# Patient Record
Sex: Female | Born: 1970 | Race: White | Hispanic: No | Smoking: Current every day smoker
Health system: Southern US, Community
[De-identification: ages and names within clinical notes are randomized; demographics above are authoritative.]

## PROBLEM LIST (undated history)

## (undated) DIAGNOSIS — E119 Type 2 diabetes mellitus without complications: Secondary | ICD-10-CM

## (undated) DIAGNOSIS — T8859XA Other complications of anesthesia, initial encounter: Secondary | ICD-10-CM

## (undated) DIAGNOSIS — F419 Anxiety disorder, unspecified: Secondary | ICD-10-CM

## (undated) DIAGNOSIS — R569 Unspecified convulsions: Secondary | ICD-10-CM

## (undated) DIAGNOSIS — F32A Depression, unspecified: Secondary | ICD-10-CM

## (undated) DIAGNOSIS — E785 Hyperlipidemia, unspecified: Secondary | ICD-10-CM

## (undated) DIAGNOSIS — E282 Polycystic ovarian syndrome: Secondary | ICD-10-CM

## (undated) DIAGNOSIS — S0990XA Unspecified injury of head, initial encounter: Secondary | ICD-10-CM

## (undated) DIAGNOSIS — F329 Major depressive disorder, single episode, unspecified: Secondary | ICD-10-CM

## (undated) DIAGNOSIS — T4145XA Adverse effect of unspecified anesthetic, initial encounter: Secondary | ICD-10-CM

## (undated) DIAGNOSIS — IMO0001 Reserved for inherently not codable concepts without codable children: Secondary | ICD-10-CM

## (undated) HISTORY — PX: BILATERAL CARPAL TUNNEL RELEASE: SHX6508

---
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
## 2014-12-29 ENCOUNTER — Other Ambulatory Visit: Payer: Self-pay | Admitting: Orthopedic Surgery

## 2015-01-02 ENCOUNTER — Encounter (HOSPITAL_COMMUNITY): Payer: Self-pay | Admitting: *Deleted

## 2015-01-02 MED ORDER — CEFAZOLIN SODIUM-DEXTROSE 2-3 GM-% IV SOLR
2.0000 g | INTRAVENOUS | Status: AC
  Administered 2015-01-03: 2 g via INTRAVENOUS

## 2015-01-02 NOTE — Progress Notes (Signed)
Ms Sara Oconnor is a Type 1 Diabetic. PCP Dr Cristobal GoldmannBorun in [facility]  Manages diabetes.  Patient reported that last A1C was > 9.0.  Patient stated that CBGs have been running  From 230 to 320.  Patient has history of CBG's dropping and patient had a seizure- last time was 3 years ago.  I have requested records from Dr Iona BeardBorun's office.  Patient is aware of NPO after midnight and also aware to not take any Insulin in am.  I instructed patient to drink 1/2 cup of clear juice if CBG should be less than 70 and to recheck CBG and call Pre- Op for further instructions.  I also instructed patient to call pre op if CBG 250 or >.  Patient voiced understanding.

## 2015-01-03 ENCOUNTER — Ambulatory Visit (HOSPITAL_COMMUNITY): Payer: Worker's Compensation

## 2015-01-03 ENCOUNTER — Ambulatory Visit (HOSPITAL_COMMUNITY)
Admission: RE | Admit: 2015-01-03 | Discharge: 2015-01-03 | Disposition: A | Discharge status: Home / Self Care | Payer: Worker's Compensation | Source: Ambulatory Visit | Attending: Orthopedic Surgery | Admitting: Orthopedic Surgery

## 2015-01-03 ENCOUNTER — Ambulatory Visit (HOSPITAL_COMMUNITY): Payer: Worker's Compensation | Admitting: Anesthesiology

## 2015-01-03 ENCOUNTER — Encounter (HOSPITAL_COMMUNITY)
Admission: RE | Disposition: A | Discharge status: Home / Self Care | Payer: Self-pay | Source: Ambulatory Visit | Attending: Orthopedic Surgery

## 2015-01-03 ENCOUNTER — Other Ambulatory Visit: Payer: Self-pay

## 2015-01-03 ENCOUNTER — Encounter (HOSPITAL_COMMUNITY): Payer: Self-pay | Admitting: *Deleted

## 2015-01-03 DIAGNOSIS — F419 Anxiety disorder, unspecified: Secondary | ICD-10-CM | POA: Diagnosis not present

## 2015-01-03 DIAGNOSIS — M5410 Radiculopathy, site unspecified: Secondary | ICD-10-CM

## 2015-01-03 DIAGNOSIS — Z7982 Long term (current) use of aspirin: Secondary | ICD-10-CM | POA: Diagnosis not present

## 2015-01-03 DIAGNOSIS — Z6841 Body Mass Index (BMI) 40.0 and over, adult: Secondary | ICD-10-CM | POA: Diagnosis not present

## 2015-01-03 DIAGNOSIS — E119 Type 2 diabetes mellitus without complications: Secondary | ICD-10-CM | POA: Diagnosis not present

## 2015-01-03 DIAGNOSIS — F329 Major depressive disorder, single episode, unspecified: Secondary | ICD-10-CM | POA: Insufficient documentation

## 2015-01-03 DIAGNOSIS — Z794 Long term (current) use of insulin: Secondary | ICD-10-CM | POA: Diagnosis not present

## 2015-01-03 DIAGNOSIS — Z01818 Encounter for other preprocedural examination: Secondary | ICD-10-CM

## 2015-01-03 DIAGNOSIS — E785 Hyperlipidemia, unspecified: Secondary | ICD-10-CM | POA: Diagnosis not present

## 2015-01-03 DIAGNOSIS — M5116 Intervertebral disc disorders with radiculopathy, lumbar region: Secondary | ICD-10-CM | POA: Insufficient documentation

## 2015-01-03 DIAGNOSIS — F17210 Nicotine dependence, cigarettes, uncomplicated: Secondary | ICD-10-CM | POA: Diagnosis not present

## 2015-01-03 HISTORY — PX: LUMBAR LAMINECTOMY/DECOMPRESSION MICRODISCECTOMY: SHX5026

## 2015-01-03 HISTORY — DX: Adverse effect of unspecified anesthetic, initial encounter: T41.45XA

## 2015-01-03 HISTORY — DX: Depression, unspecified: F32.A

## 2015-01-03 HISTORY — DX: Type 2 diabetes mellitus without complications: E11.9

## 2015-01-03 HISTORY — DX: Other complications of anesthesia, initial encounter: T88.59XA

## 2015-01-03 HISTORY — DX: Polycystic ovarian syndrome: E28.2

## 2015-01-03 HISTORY — DX: Unspecified convulsions: R56.9

## 2015-01-03 HISTORY — DX: Unspecified injury of head, initial encounter: S09.90XA

## 2015-01-03 HISTORY — DX: Anxiety disorder, unspecified: F41.9

## 2015-01-03 HISTORY — DX: Reserved for inherently not codable concepts without codable children: IMO0001

## 2015-01-03 HISTORY — DX: Hyperlipidemia, unspecified: E78.5

## 2015-01-03 HISTORY — DX: Major depressive disorder, single episode, unspecified: F32.9

## 2015-01-03 LAB — COMPREHENSIVE METABOLIC PANEL
ALT: 42 U/L (ref 14–54)
ANION GAP: 8 (ref 5–15)
AST: 27 U/L (ref 15–41)
Albumin: 3.3 g/dL — ABNORMAL LOW (ref 3.5–5.0)
Alkaline Phosphatase: 87 U/L (ref 38–126)
BUN: 11 mg/dL (ref 6–20)
CO2: 23 mmol/L (ref 22–32)
Calcium: 8.9 mg/dL (ref 8.9–10.3)
Chloride: 107 mmol/L (ref 101–111)
Creatinine, Ser: 0.79 mg/dL (ref 0.44–1.00)
GFR calc Af Amer: >60 mL/min (ref >60)
GFR calc non Af Amer: >60 mL/min (ref >60)
Glucose, Bld: 307 mg/dL — ABNORMAL HIGH (ref 70–99)
Potassium: 4.4 mmol/L (ref 3.5–5.1)
SODIUM: 138 mmol/L (ref 135–145)
TOTAL PROTEIN: 5.8 g/dL — AB (ref 6.5–8.1)
Total Bilirubin: 0.5 mg/dL (ref 0.3–1.2)

## 2015-01-03 LAB — PROTIME-INR
INR: 0.99 (ref 0.00–1.49)
Prothrombin Time: 13.2 seconds (ref 11.6–15.2)

## 2015-01-03 LAB — CBC WITH DIFFERENTIAL/PLATELET
BASOS ABS: 0.1 10*3/uL (ref 0.0–0.1)
Basophils Relative: 1 % (ref 0–1)
Eosinophils Absolute: 0.6 10*3/uL (ref 0.0–0.7)
Eosinophils Relative: 6 % — ABNORMAL HIGH (ref 0–5)
HCT: 41.7 % (ref 36.0–46.0)
Hemoglobin: 14 g/dL (ref 12.0–15.0)
LYMPHS ABS: 3.1 10*3/uL (ref 0.7–4.0)
Lymphocytes Relative: 28 % (ref 12–46)
MCH: 30.8 pg (ref 26.0–34.0)
MCHC: 33.6 g/dL (ref 30.0–36.0)
MCV: 91.9 fL (ref 78.0–100.0)
Monocytes Absolute: 0.5 10*3/uL (ref 0.1–1.0)
Monocytes Relative: 5 % (ref 3–12)
Neutro Abs: 6.8 10*3/uL (ref 1.7–7.7)
Neutrophils Relative %: 60 % (ref 43–77)
PLATELETS: 248 10*3/uL (ref 150–400)
RBC: 4.54 MIL/uL (ref 3.87–5.11)
RDW: 13.5 % (ref 11.5–15.5)
WBC: 11.1 10*3/uL — ABNORMAL HIGH (ref 4.0–10.5)

## 2015-01-03 LAB — SURGICAL PCR SCREEN
MRSA, PCR: NEGATIVE
STAPHYLOCOCCUS AUREUS: POSITIVE — AB

## 2015-01-03 LAB — URINALYSIS, ROUTINE W REFLEX MICROSCOPIC
Bilirubin Urine: NEGATIVE
Hgb urine dipstick: NEGATIVE
Ketones, ur: 15 mg/dL — AB
LEUKOCYTES UA: NEGATIVE
Nitrite: NEGATIVE
Protein, ur: NEGATIVE mg/dL
Specific Gravity, Urine: 1.023 (ref 1.005–1.030)
Urobilinogen, UA: 0.2 mg/dL (ref 0.0–1.0)
pH: 5 (ref 5.0–8.0)

## 2015-01-03 LAB — GLUCOSE, CAPILLARY
GLUCOSE-CAPILLARY: 303 mg/dL — AB (ref 70–99)
GLUCOSE-CAPILLARY: 259 mg/dL — AB (ref 70–99)
GLUCOSE-CAPILLARY: 223 mg/dL — AB (ref 70–99)
Glucose-Capillary: 306 mg/dL — ABNORMAL HIGH (ref 70–99)
Glucose-Capillary: 246 mg/dL — ABNORMAL HIGH (ref 70–99)

## 2015-01-03 LAB — APTT: aPTT: 27 seconds (ref 24–37)

## 2015-01-03 LAB — HCG, SERUM, QUALITATIVE: Preg, Serum: NEGATIVE

## 2015-01-03 LAB — URINE MICROSCOPIC-ADD ON

## 2015-01-03 SURGERY — LUMBAR LAMINECTOMY/DECOMPRESSION MICRODISCECTOMY
Anesthesia: General | Site: Back | Laterality: Left

## 2015-01-03 MED ORDER — METHYLPREDNISOLONE ACETATE 40 MG/ML IJ SUSP
INTRAMUSCULAR | Status: DC | PRN
  Administered 2015-01-03: 40 mg via INTRA_ARTICULAR

## 2015-01-03 MED ORDER — OXYCODONE-ACETAMINOPHEN 5-325 MG PO TABS
2.0000 | ORAL_TABLET | Freq: Once | ORAL | Status: AC
  Administered 2015-01-03: 2 via ORAL

## 2015-01-03 MED ORDER — DIAZEPAM 5 MG PO TABS
5.0000 mg | ORAL_TABLET | Freq: Once | ORAL | Status: AC
  Administered 2015-01-03: 5 mg via ORAL

## 2015-01-03 MED ORDER — THROMBIN 20000 UNITS EX SOLR
CUTANEOUS | Status: DC | PRN
  Administered 2015-01-03: 20 mL via TOPICAL

## 2015-01-03 MED ORDER — BUPIVACAINE-EPINEPHRINE 0.25% -1:200000 IJ SOLN
INTRAMUSCULAR | Status: DC | PRN
  Administered 2015-01-03: 21 mL

## 2015-01-03 MED ORDER — ONDANSETRON HCL 4 MG/2ML IJ SOLN
INTRAMUSCULAR | Status: DC | PRN
  Administered 2015-01-03: 4 mg via INTRAVENOUS

## 2015-01-03 MED ORDER — PROPOFOL 10 MG/ML IV BOLUS
INTRAVENOUS | Status: AC
  Filled 2015-01-03: qty 20

## 2015-01-03 MED ORDER — FENTANYL CITRATE (PF) 250 MCG/5ML IJ SOLN
INTRAMUSCULAR | Status: AC
  Filled 2015-01-03: qty 5

## 2015-01-03 MED ORDER — ACETAMINOPHEN 10 MG/ML IV SOLN
1000.0000 mg | INTRAVENOUS | Status: AC
  Administered 2015-01-03: 1000 mg via INTRAVENOUS
  Filled 2015-01-03: qty 100

## 2015-01-03 MED ORDER — BUPIVACAINE-EPINEPHRINE (PF) 0.25% -1:200000 IJ SOLN
INTRAMUSCULAR | Status: AC
  Filled 2015-01-03: qty 30

## 2015-01-03 MED ORDER — MUPIROCIN 2 % EX OINT
1.0000 "application " | TOPICAL_OINTMENT | Freq: Once | CUTANEOUS | Status: AC
  Administered 2015-01-03: 1 via TOPICAL

## 2015-01-03 MED ORDER — OXYCODONE-ACETAMINOPHEN 5-325 MG PO TABS
ORAL_TABLET | ORAL | Status: AC
  Filled 2015-01-03: qty 2

## 2015-01-03 MED ORDER — METHYLPREDNISOLONE ACETATE 40 MG/ML IJ SUSP
INTRAMUSCULAR | Status: AC
  Filled 2015-01-03: qty 1

## 2015-01-03 MED ORDER — DEXAMETHASONE SODIUM PHOSPHATE 4 MG/ML IJ SOLN
INTRAMUSCULAR | Status: DC | PRN
Start: 2015-01-03 — End: 2015-01-04
  Administered 2015-01-03: 4 mg via INTRAVENOUS

## 2015-01-03 MED ORDER — GLYCOPYRROLATE 0.2 MG/ML IJ SOLN
INTRAMUSCULAR | Status: DC | PRN
  Administered 2015-01-03: 0.6 mg via INTRAVENOUS

## 2015-01-03 MED ORDER — 0.9 % SODIUM CHLORIDE (POUR BTL) OPTIME
TOPICAL | Status: DC | PRN
  Administered 2015-01-03: 1000 mL

## 2015-01-03 MED ORDER — MIDAZOLAM HCL 5 MG/5ML IJ SOLN
INTRAMUSCULAR | Status: DC | PRN
  Administered 2015-01-03: 2 mg via INTRAVENOUS

## 2015-01-03 MED ORDER — PROMETHAZINE HCL 25 MG/ML IJ SOLN: 6.2500 mg | INTRAMUSCULAR | Status: DC | PRN

## 2015-01-03 MED ORDER — INSULIN ASPART 100 UNIT/ML ~~LOC~~ SOLN
10.0000 [IU] | Freq: Once | SUBCUTANEOUS | Status: AC
  Administered 2015-01-03: 10 [IU] via SUBCUTANEOUS

## 2015-01-03 MED ORDER — METHYLENE BLUE 1 % INJ SOLN
INTRAMUSCULAR | Status: DC | PRN
  Administered 2015-01-03: 1 mL via SUBMUCOSAL

## 2015-01-03 MED ORDER — HYDROMORPHONE HCL 1 MG/ML IJ SOLN
INTRAMUSCULAR | Status: AC
  Administered 2015-01-03: 0.5 mg via INTRAVENOUS
  Filled 2015-01-03: qty 2

## 2015-01-03 MED ORDER — MIDAZOLAM HCL 2 MG/2ML IJ SOLN
INTRAMUSCULAR | Status: AC
  Filled 2015-01-03: qty 2

## 2015-01-03 MED ORDER — HEMOSTATIC AGENTS (NO CHARGE) OPTIME
TOPICAL | Status: DC | PRN
  Administered 2015-01-03: 1 via TOPICAL

## 2015-01-03 MED ORDER — FENTANYL CITRATE (PF) 100 MCG/2ML IJ SOLN
INTRAMUSCULAR | Status: DC | PRN
  Administered 2015-01-03 (×2): 50 ug via INTRAVENOUS

## 2015-01-03 MED ORDER — NEOSTIGMINE METHYLSULFATE 10 MG/10ML IV SOLN
INTRAVENOUS | Status: DC | PRN
  Administered 2015-01-03: 5 mg via INTRAVENOUS

## 2015-01-03 MED ORDER — MUPIROCIN 2 % EX OINT
TOPICAL_OINTMENT | CUTANEOUS | Status: AC
  Filled 2015-01-03: qty 22

## 2015-01-03 MED ORDER — LIDOCAINE HCL (CARDIAC) 20 MG/ML IV SOLN
INTRAVENOUS | Status: DC | PRN
  Administered 2015-01-03: 100 mg via INTRAVENOUS

## 2015-01-03 MED ORDER — PROPOFOL 10 MG/ML IV BOLUS
INTRAVENOUS | Status: DC | PRN
  Administered 2015-01-03: 200 mg via INTRAVENOUS

## 2015-01-03 MED ORDER — LACTATED RINGERS IV SOLN
INTRAVENOUS | Status: DC | PRN
  Administered 2015-01-03 (×2): via INTRAVENOUS

## 2015-01-03 MED ORDER — METHYLENE BLUE 1 % INJ SOLN
INTRAMUSCULAR | Status: AC
  Filled 2015-01-03: qty 10

## 2015-01-03 MED ORDER — SUCCINYLCHOLINE CHLORIDE 20 MG/ML IJ SOLN
INTRAMUSCULAR | Status: DC | PRN
Start: 2015-01-03 — End: 2015-01-04
  Administered 2015-01-03: 80 mg via INTRAVENOUS
  Administered 2015-01-03: 120 mg via INTRAVENOUS
  Administered 2015-01-03: 100 mg via INTRAVENOUS

## 2015-01-03 MED ORDER — VECURONIUM BROMIDE 10 MG IV SOLR
INTRAVENOUS | Status: DC | PRN
  Administered 2015-01-03: 5 mg via INTRAVENOUS
  Administered 2015-01-03: 2 mg via INTRAVENOUS

## 2015-01-03 MED ORDER — THROMBIN 20000 UNITS EX SOLR
CUTANEOUS | Status: AC
Start: 2015-01-03 — End: 2015-01-03
  Filled 2015-01-03: qty 20000

## 2015-01-03 MED ORDER — LIDOCAINE HCL (CARDIAC) 20 MG/ML IV SOLN
INTRAVENOUS | Status: AC
  Filled 2015-01-03: qty 5

## 2015-01-03 MED ORDER — DIAZEPAM 5 MG PO TABS
ORAL_TABLET | ORAL | Status: AC
  Filled 2015-01-03: qty 1

## 2015-01-03 MED ORDER — POVIDONE-IODINE 7.5 % EX SOLN: Freq: Once | CUTANEOUS | Status: DC

## 2015-01-03 MED ORDER — ROCURONIUM BROMIDE 50 MG/5ML IV SOLN
INTRAVENOUS | Status: AC
  Filled 2015-01-03: qty 1

## 2015-01-03 MED ORDER — HYDROMORPHONE HCL 1 MG/ML IJ SOLN
0.2500 mg | INTRAMUSCULAR | Status: DC | PRN
Start: 2015-01-03 — End: 2015-01-04
  Administered 2015-01-03 (×3): 0.5 mg via INTRAVENOUS

## 2015-01-03 SURGICAL SUPPLY — 75 items
BENZOIN TINCTURE PRP APPL 2/3 (GAUZE/BANDAGES/DRESSINGS) ×3 IMPLANT
BNDG GAUZE ELAST 4 BULKY (GAUZE/BANDAGES/DRESSINGS) ×3 IMPLANT
BUR ROUND PRECISION 4.0 (BURR) ×2 IMPLANT
BUR ROUND PRECISION 4.0MM (BURR) ×1
CANISTER SUCTION 2500CC (MISCELLANEOUS) ×3 IMPLANT
CARTRIDGE OIL MAESTRO DRILL (MISCELLANEOUS) ×1 IMPLANT
CLOSURE STERI-STRIP 1/2X4 (GAUZE/BANDAGES/DRESSINGS) ×1
CLOSURE WOUND 1/2 X4 (GAUZE/BANDAGES/DRESSINGS)
CLSR STERI-STRIP ANTIMIC 1/2X4 (GAUZE/BANDAGES/DRESSINGS) ×2 IMPLANT
CORDS BIPOLAR (ELECTRODE) ×3 IMPLANT
COVER SURGICAL LIGHT HANDLE (MISCELLANEOUS) ×3 IMPLANT
DIFFUSER DRILL AIR PNEUMATIC (MISCELLANEOUS) ×3 IMPLANT
DRAIN CHANNEL 15F RND FF W/TCR (WOUND CARE) IMPLANT
DRAPE POUCH INSTRU U-SHP 10X18 (DRAPES) ×6 IMPLANT
DRAPE SURG 17X23 STRL (DRAPES) ×12 IMPLANT
DURAPREP 26ML APPLICATOR (WOUND CARE) ×3 IMPLANT
ELECT BLADE 4.0 EZ CLEAN MEGAD (MISCELLANEOUS)
ELECT CAUTERY BLADE 6.4 (BLADE) ×3 IMPLANT
ELECT REM PT RETURN 9FT ADLT (ELECTROSURGICAL) ×3
ELECTRODE BLDE 4.0 EZ CLN MEGD (MISCELLANEOUS) IMPLANT
ELECTRODE REM PT RTRN 9FT ADLT (ELECTROSURGICAL) ×1 IMPLANT
EVACUATOR SILICONE 100CC (DRAIN) IMPLANT
FILTER STRAW FLUID ASPIR (MISCELLANEOUS) ×3 IMPLANT
GAUZE SPONGE 4X4 12PLY STRL (GAUZE/BANDAGES/DRESSINGS) ×3 IMPLANT
GAUZE SPONGE 4X4 16PLY XRAY LF (GAUZE/BANDAGES/DRESSINGS) ×6 IMPLANT
GLOVE BIO SURGEON STRL SZ7 (GLOVE) ×3 IMPLANT
GLOVE BIO SURGEON STRL SZ8 (GLOVE) ×3 IMPLANT
GLOVE BIOGEL PI IND STRL 7.0 (GLOVE) ×1 IMPLANT
GLOVE BIOGEL PI IND STRL 8 (GLOVE) ×1 IMPLANT
GLOVE BIOGEL PI INDICATOR 7.0 (GLOVE) ×2
GLOVE BIOGEL PI INDICATOR 8 (GLOVE) ×2
GOWN STRL REUS W/ TWL LRG LVL3 (GOWN DISPOSABLE) ×1 IMPLANT
GOWN STRL REUS W/ TWL XL LVL3 (GOWN DISPOSABLE) ×2 IMPLANT
GOWN STRL REUS W/TWL LRG LVL3 (GOWN DISPOSABLE) ×2
GOWN STRL REUS W/TWL XL LVL3 (GOWN DISPOSABLE) ×4
IV CATH 14GX2 1/4 (CATHETERS) ×3 IMPLANT
KIT BASIN OR (CUSTOM PROCEDURE TRAY) ×3 IMPLANT
KIT POSITION SURG JACKSON T1 (MISCELLANEOUS) ×3 IMPLANT
KIT ROOM TURNOVER OR (KITS) ×3 IMPLANT
MARKER SKIN DUAL TIP RULER LAB (MISCELLANEOUS) ×3 IMPLANT
NEEDLE 18GX1X1/2 (RX/OR ONLY) (NEEDLE) ×3 IMPLANT
NEEDLE 22X1 1/2 (OR ONLY) (NEEDLE) ×6 IMPLANT
NEEDLE HYPO 25GX1X1/2 BEV (NEEDLE) ×3 IMPLANT
NEEDLE SPNL 18GX3.5 QUINCKE PK (NEEDLE) ×6 IMPLANT
NS IRRIG 1000ML POUR BTL (IV SOLUTION) ×3 IMPLANT
OIL CARTRIDGE MAESTRO DRILL (MISCELLANEOUS) ×3
PACK LAMINECTOMY ORTHO (CUSTOM PROCEDURE TRAY) ×3 IMPLANT
PACK UNIVERSAL I (CUSTOM PROCEDURE TRAY) ×3 IMPLANT
PAD ARMBOARD 7.5X6 YLW CONV (MISCELLANEOUS) ×6 IMPLANT
PATTIES SURGICAL .5 X.5 (GAUZE/BANDAGES/DRESSINGS) IMPLANT
PATTIES SURGICAL .5 X1 (DISPOSABLE) ×3 IMPLANT
SPONGE INTESTINAL PEANUT (DISPOSABLE) ×3 IMPLANT
SPONGE SURGIFOAM ABS GEL 100 (HEMOSTASIS) ×3 IMPLANT
SPONGE SURGIFOAM ABS GEL SZ50 (HEMOSTASIS) ×3 IMPLANT
STRIP CLOSURE SKIN 1/2X4 (GAUZE/BANDAGES/DRESSINGS) IMPLANT
SURGIFLO TRUKIT (HEMOSTASIS) ×3 IMPLANT
SUT MNCRL AB 4-0 PS2 18 (SUTURE) ×3 IMPLANT
SUT VIC AB 0 CT1 18XCR BRD 8 (SUTURE) IMPLANT
SUT VIC AB 0 CT1 27 (SUTURE)
SUT VIC AB 0 CT1 27XBRD ANBCTR (SUTURE) IMPLANT
SUT VIC AB 0 CT1 8-18 (SUTURE)
SUT VIC AB 1 CT1 18XCR BRD 8 (SUTURE) ×1 IMPLANT
SUT VIC AB 1 CT1 8-18 (SUTURE) ×2
SUT VIC AB 2-0 CT2 18 VCP726D (SUTURE) ×3 IMPLANT
SYR 20CC LL (SYRINGE) IMPLANT
SYR 3ML LL SCALE MARK (SYRINGE) ×3 IMPLANT
SYR BULB IRRIGATION 50ML (SYRINGE) ×3 IMPLANT
SYR CONTROL 10ML LL (SYRINGE) ×6 IMPLANT
SYR TB 1ML 26GX3/8 SAFETY (SYRINGE) ×6 IMPLANT
SYR TB 1ML LUER SLIP (SYRINGE) ×6 IMPLANT
TAPE CLOTH SURG 4X10 WHT LF (GAUZE/BANDAGES/DRESSINGS) ×3 IMPLANT
TOWEL OR 17X24 6PK STRL BLUE (TOWEL DISPOSABLE) ×3 IMPLANT
TOWEL OR 17X26 10 PK STRL BLUE (TOWEL DISPOSABLE) ×6 IMPLANT
WATER STERILE IRR 1000ML POUR (IV SOLUTION) IMPLANT
YANKAUER SUCT BULB TIP NO VENT (SUCTIONS) ×3 IMPLANT

## 2015-01-03 NOTE — Anesthesia Postprocedure Evaluation (Signed)
  Anesthesia Post-op Note  Patient: Sara Oconnor  Procedure(s) Performed: Procedure(s) with comments: LUMBAR LAMINECTOMY/DECOMPRESSION MICRODISCECTOMY (Left) - Left sided lumbar 4-5 microdisectomy  Patient Location: PACU  Anesthesia Type: General   Level of Consciousness: awake, alert  and oriented  Airway and Oxygen Therapy: Patient Spontanous Breathing  Post-op Pain: mild  Post-op Assessment: Post-op Vital signs reviewed  Post-op Vital Signs: Reviewed  Last Vitals:  Filed Vitals:   01/03/15 1745  BP: 126/82  Pulse: 91  Temp:   Resp: 17    Complications: No apparent anesthesia complications

## 2015-01-03 NOTE — Transfer of Care (Signed)
Immediate Anesthesia Transfer of Care Note  Patient: Sara Oconnor  Procedure(s) Performed: Procedure(s) with comments: LUMBAR LAMINECTOMY/DECOMPRESSION MICRODISCECTOMY (Left) - Left sided lumbar 4-5 microdisectomy  Patient Location: PACU  Anesthesia Type:General  Level of Consciousness: awake, oriented and patient cooperative  Airway & Oxygen Therapy: Patient Spontanous Breathing and Patient connected to face mask oxygen  Post-op Assessment: Report given to RN and Post -op Vital signs reviewed and stable  Post vital signs: Reviewed  Last Vitals:  Filed Vitals:   01/03/15 1640  BP:   Pulse: 84  Temp: 37.2 C    Complications: No apparent anesthesia complications

## 2015-01-03 NOTE — H&P (Signed)
PREOPERATIVE H&P  Chief Complaint: Left leg pain  HPI: Sara Oconnor is a 44 y.o. female who presents with ongoing pain in the left leg   MRI reveals HNP on left at L4/5  Patient has failed multiple forms of conservative care and continues to have pain (see office notes for additional details regarding the patient's full course of treatment)  Past Medical History  Diagnosis Date  . Complication of anesthesia     with tonsillectomy heart beat was irregular  "GAS"  . Shortness of breath dyspnea     with exertion  . Head injury     low CBG- had stitches  . Seizures     low CBG's Last Seizures  . Diabetes mellitus without complication     Type 1  . Depression   . Anxiety     Panic attacks  . Hyperlipemia   . Polycystic ovarian disease    Past Surgical History  Procedure Laterality Date  . Bilateral carpal tunnel release  2003ish    2 weeks apart  . Tonsillectomy  1979   History   Social History  . Marital Status: Divorced    Spouse Name: N/A  . Number of Children: N/A  . Years of Education: N/A   Social History Main Topics  . Smoking status: Current Every Day Smoker -- 1.00 packs/day for 21 years  . Smokeless tobacco: Not on file  . Alcohol Use: No  . Drug Use: No  . Sexual Activity: Not on file   Other Topics Concern  . None   Social History Narrative  . None   Family History  Problem Relation Age of Onset  . Cancer Mother   . Cancer Father   . Cancer Maternal Grandfather    No Known Allergies Prior to Admission medications   Medication Sig Start Date End Date Taking? Authorizing Provider  aspirin 81 MG tablet Take 81 mg by mouth daily.   Yes Historical Provider, MD  CALCIUM PO Take 2 tablets by mouth daily.   Yes Historical Provider, MD  Cholecalciferol (VITAMIN D PO) Take 1 capsule by mouth once a week.   Yes Historical Provider, MD  DULoxetine (CYMBALTA) 60 MG capsule Take 60 mg by mouth daily.   Yes Historical Provider, MD  esomeprazole  (NEXIUM) 40 MG capsule Take 40 mg by mouth daily at 12 noon.   Yes Historical Provider, MD  HYDROcodone-acetaminophen (NORCO/VICODIN) 5-325 MG per tablet Take 1 tablet by mouth every 4 (four) hours as needed for moderate pain.   Yes Historical Provider, MD  ibuprofen (ADVIL,MOTRIN) 200 MG tablet Take 600 mg by mouth every 6 (six) hours as needed for headache or moderate pain.   Yes Historical Provider, MD  insulin aspart (NOVOLOG) 100 UNIT/ML injection Inject 0-40 Units into the skin 3 (three) times daily before meals. Per sliding scale   Yes Historical Provider, MD  insulin glargine (LANTUS) 100 UNIT/ML injection Inject 35 Units into the skin 2 (two) times daily.   Yes Historical Provider, MD  lisinopril (PRINIVIL,ZESTRIL) 20 MG tablet Take 20 mg by mouth daily.   Yes Historical Provider, MD  lovastatin (MEVACOR) 40 MG tablet Take 40 mg by mouth at bedtime.   Yes Historical Provider, MD  Multiple Vitamins-Minerals (MULTIVITAMIN PO) Take 1 tablet by mouth daily.   Yes Historical Provider, MD     All other systems have been reviewed and were otherwise negative with the exception of those mentioned in the HPI and as above.  Physical Exam: There were no vitals filed for this visit.  General: Alert, no acute distress Cardiovascular: No pedal edema Respiratory: No cyanosis, no use of accessory musculature Skin: No lesions in the area of chief complaint Neurologic: Sensation intact distally Psychiatric: Patient is competent for consent with normal mood and affect Lymphatic: No axillary or cervical lymphadenopathy  MUSCULOSKELETAL: + SLR on left  Assessment/Plan: Left leg pain Plan for Procedure(s): LUMBAR LAMINECTOMY/DECOMPRESSION MICRODISCECTOMY L4/5   Emilee HeroUMONSKI,Hayat [place] LEONARD, MD 01/03/2015 8:25 AM

## 2015-01-03 NOTE — Anesthesia Preprocedure Evaluation (Signed)
Anesthesia Evaluation  Patient identified by MRN, date of birth, ID band Patient awake    Reviewed: Allergy & Precautions, NPO status , Patient's Chart, lab work & pertinent test results  Airway Mallampati: II  TM Distance: <3 FB Neck ROM: Full    Dental no notable dental hx.    Pulmonary Current Smoker,  breath sounds clear to auscultation  Pulmonary exam normal       Cardiovascular negative cardio ROS Normal cardiovascular examRhythm:Regular Rate:Normal     Neuro/Psych Anxiety negative neurological ROS     GI/Hepatic negative GI ROS, Neg liver ROS,   Endo/Other  diabetes, Insulin DependentMorbid obesity  Renal/GU negative Renal ROS  negative genitourinary   Musculoskeletal negative musculoskeletal ROS (+)   Abdominal   Peds negative pediatric ROS (+)  Hematology negative hematology ROS (+)   Anesthesia Other Findings   Reproductive/Obstetrics negative OB ROS                             Anesthesia Physical Anesthesia Plan  ASA: III  Anesthesia Plan: General   Post-op Pain Management:    Induction: Intravenous  Airway Management Planned: Oral ETT  Additional Equipment:   Intra-op Plan:   Post-operative Plan: Extubation in OR  Informed Consent: I have reviewed the patients History and Physical, chart, labs and discussed the procedure including the risks, benefits and alternatives for the proposed anesthesia with the patient or authorized representative who has indicated his/her understanding and acceptance.   Dental advisory given  Plan Discussed with: CRNA and Surgeon  Anesthesia Plan Comments:         Anesthesia Quick Evaluation

## 2015-01-03 NOTE — Anesthesia Procedure Notes (Signed)
Procedure Name: Intubation Date/Time: 01/03/2015 1:45 PM Performed by: Lovie CholOCK, Levonne Carreras K Pre-anesthesia Checklist: Patient identified, Emergency Drugs available, Suction available, Patient being monitored and Timeout performed Patient Re-evaluated:Patient Re-evaluated prior to inductionOxygen Delivery Method: Circle system utilized Preoxygenation: Pre-oxygenation with 100% oxygen Intubation Type: IV induction Ventilation: Mask ventilation with difficulty, Two handed mask ventilation required, Nasal airway inserted- appropriate to patient size and Oral airway inserted - appropriate to patient size Grade View: Grade III Tube type: Oral Tube size: 7.0 mm Number of attempts: 1 Airway Equipment and Method: Video-laryngoscopy and Rigid stylet Placement Confirmation: positive ETCO2,  CO2 detector and breath sounds checked- equal and bilateral Secured at: 21 cm Tube secured with: Tape Dental Injury: Teeth and Oropharynx as per pre-operative assessment  Difficulty Due To: Difficulty was anticipated, Difficult Airway- due to large tongue and Difficult Airway- due to limited oral opening Future Recommendations: Recommend- induction with short-acting agent, and alternative techniques readily available Comments: Smooth IV induction. Difficult mask with oral and nasal trumpet, and 2 handed mask. DL x 1 MAC 3. Grade 4 view. Immediately re-masked patient. Atraumatic oral intubation with glidescope. bbs = +ETCO2

## 2015-01-03 NOTE — Progress Notes (Signed)
Dr.Rose notified of blood sugar 303-orders received to give 10 units of Novolog subcu

## 2015-01-04 ENCOUNTER — Encounter (HOSPITAL_COMMUNITY): Payer: Self-pay | Admitting: Orthopedic Surgery

## 2015-01-04 LAB — GLUCOSE, CAPILLARY: Glucose-Capillary: 197 mg/dL — ABNORMAL HIGH (ref 65–99)

## 2015-01-04 NOTE — Op Note (Signed)
NAMMonia Oconnor:  Oconnor, Sara               ACCOUNT NO.:  [id number]  MEDICAL RECORD NO.:  [id number]  LOCATION:  [facility]                         FACILITY:  [facility]  PHYSICIAN:  Estill BambergMark Autumnrose Yore, MD      DATE OF BIRTH:  1970/11/07  DATE OF PROCEDURE:  01/03/2015 DATE OF DISCHARGE:  01/03/2015                              OPERATIVE REPORT   PREOPERATIVE DIAGNOSES: 1. Left-sided L5 radiculopathy. 2. Left-sided L4-5 disc herniation.  POSTOPERATIVE DIAGNOSES: 1. Left-sided L5 radiculopathy. 2. Left-sided L4-5 disc herniation.  PROCEDURES:  Left-sided L4-5 laminotomy with partial facetectomy and removal of large left-sided L4-5 intervertebral disk herniation.  SURGEON:  Estill BambergMark Ylianna Almanzar, MD  ASSISTANTS:  Jason CoopKayla McKenzie, PA-C.  ANESTHESIA:  General endotracheal anesthesia.  COMPLICATIONS:  None.  DISPOSITION:  Stable.  ESTIMATED BLOOD LOSS:  Minimal.  INDICATIONS FOR SURGERY:  Briefly, Ms. Sara Oconnor is a very pleasant 44 year old female, who did present to me on December 04, 2014, with severe pain in her left leg, which did occur on October 12, 2014.  An MRI did reveal a large left-sided L4-5 disk herniation.  I did feel that the herniation was corresponding to her pain.  We did go forward with multiple forms of conservative care, and the patient did continue to have pain.  We therefore did discuss proceeding with the procedure reflected above as outlined in my preoperative notes.  OPERATIVE DETAILS:  On Jan 03, 2015, the patient was brought to Surgery and general endotracheal anesthesia was administered.  The patient was placed prone on a well-padded flat Jackson bed with a spinal frame. Antibiotics were given and the time-out procedure was performed after prepping and draping the back in the usual fashion.  A midline incision was then made overlying the L4-5 intervertebral space.  The fascia was incised in a curvilinear fashion just to the left of the midline.  The paraspinal musculature was  bluntly swept laterally and a Taylor retractor was secured over the lateral aspect of the L4-5 facet joint and was held in place using Kerlix.  A lateral intraoperative radiograph did confirm the appropriate operative level.  I then performed a hemilaminotomy removing the inferior aspect of L4 and the superior aspect of L5.  The ligamentum flavum was identified and removed.  The traversing L5 nerve was identified and noted to be under substantial tension.  I was able to safely retract the nerve medially.  Just beneath the nerve, there was a prominent herniated fragment, which was readily apparent.  I did remove multiple fragments of disk.  There did continue to be a prominence just beneath the L5 traversing nerve, which I did feel was continuing to cause compression.  I therefore did perform an annulotomy and I did remove additional portions of intervertebral disk, which were displaced into the intervertebral space and removed using a series of straight and angled micropituitaries.  I was very pleased with the decompression, then I was able to accomplish.  The wound was copiously irrigated.  Bleeding was controlled using Surgiflo.  A 40 mg of Depo-Medrol was introduced about the epidural space.  The fascia was then closed using #1 Vicryl.  The wound was then closed in layers using 0  Vicryl followed by 2-0 Vicryl, followed by 3-0 Monocryl.  Benzoin and Steri-Strips were applied followed by sterile dressing.  All instrument counts were correct at the termination of the procedure.  Of note, Jason CoopKayla McKenzie, was my assistant throughout surgery, and did aid in retraction, suctioning, and closure throughout the surgery.     Estill BambergMark Cinde Ebert, MD     MD/MEDQ  D:  01/03/2015  T:  01/04/2015  Job:  [id number]

## 2016-10-28 IMAGING — CR DG CHEST 2V
2 series · 2 of 2 positions shown · non-contrast
Comparison: None.

CLINICAL DATA: Preop back surgery

EXAM:
CHEST  2 VIEW

[w chest pa]
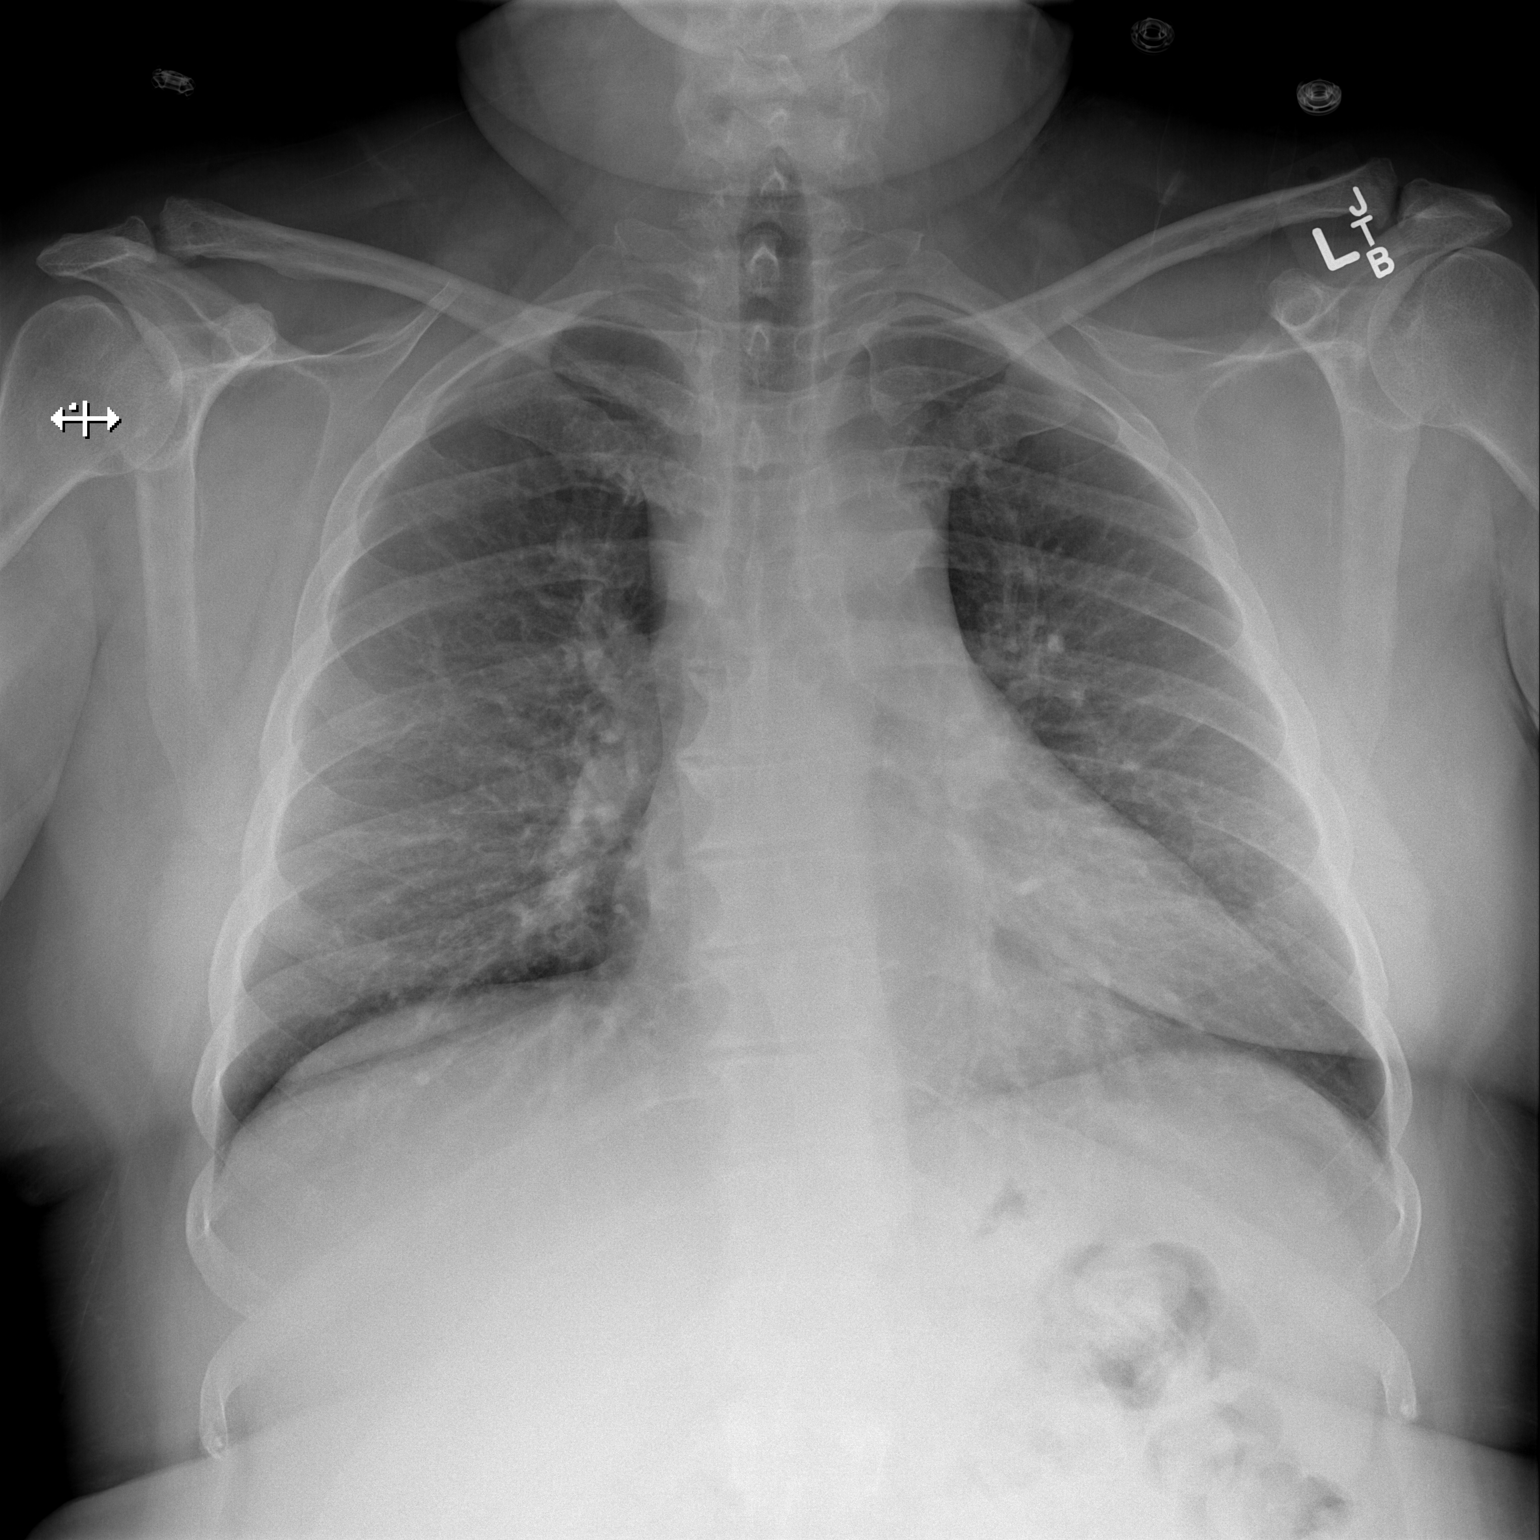

[w chest lat]
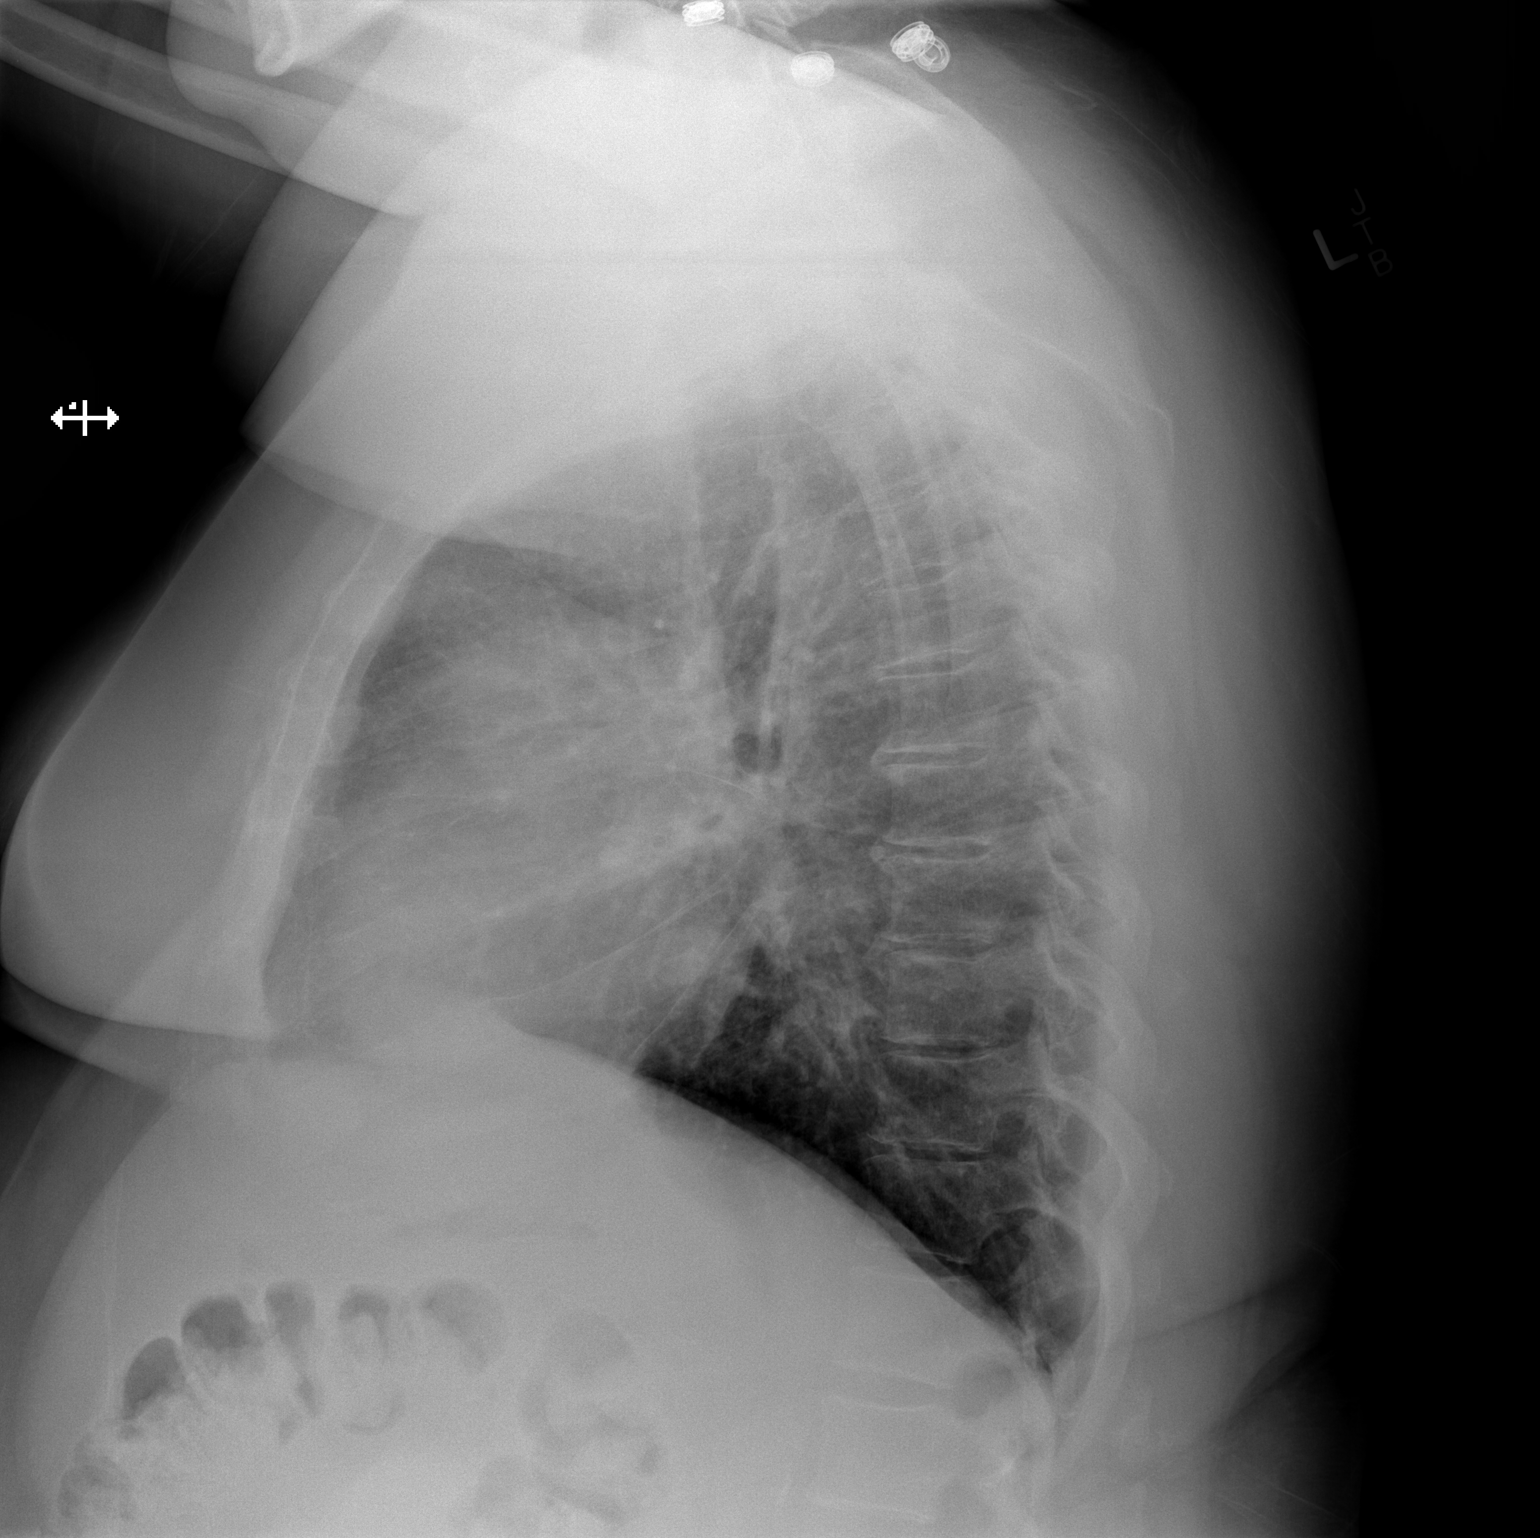

[2 of 2 positions shown; findings below may reference images not displayed]

FINDINGS: The heart size and mediastinal contours are within normal limits.
Both lungs are clear. The visualized skeletal structures are
unremarkable.
IMPRESSION: No active cardiopulmonary disease.

## 2023-11-12 DIAGNOSIS — R109 Unspecified abdominal pain: Secondary | ICD-10-CM | POA: Diagnosis not present

## 2023-11-12 DIAGNOSIS — R197 Diarrhea, unspecified: Secondary | ICD-10-CM | POA: Diagnosis not present

## 2023-11-12 DIAGNOSIS — R140 Abdominal distension (gaseous): Secondary | ICD-10-CM | POA: Diagnosis not present

## 2023-11-12 DIAGNOSIS — R110 Nausea: Secondary | ICD-10-CM | POA: Diagnosis not present

## 2024-01-16 DIAGNOSIS — F1020 Alcohol dependence, uncomplicated: Secondary | ICD-10-CM | POA: Diagnosis not present

## 2024-01-16 DIAGNOSIS — I4891 Unspecified atrial fibrillation: Secondary | ICD-10-CM | POA: Diagnosis not present

## 2024-01-16 DIAGNOSIS — I493 Ventricular premature depolarization: Secondary | ICD-10-CM | POA: Diagnosis not present

## 2024-01-16 DIAGNOSIS — Z794 Long term (current) use of insulin: Secondary | ICD-10-CM | POA: Diagnosis not present

## 2024-01-16 DIAGNOSIS — I63449 Cerebral infarction due to embolism of unspecified cerebellar artery: Secondary | ICD-10-CM | POA: Diagnosis not present

## 2024-01-16 DIAGNOSIS — R4781 Slurred speech: Secondary | ICD-10-CM | POA: Diagnosis not present

## 2024-01-16 DIAGNOSIS — I160 Hypertensive urgency: Secondary | ICD-10-CM | POA: Diagnosis not present

## 2024-01-16 DIAGNOSIS — E559 Vitamin D deficiency, unspecified: Secondary | ICD-10-CM | POA: Diagnosis not present

## 2024-01-16 DIAGNOSIS — Z7982 Long term (current) use of aspirin: Secondary | ICD-10-CM | POA: Diagnosis not present

## 2024-01-16 DIAGNOSIS — R471 Dysarthria and anarthria: Secondary | ICD-10-CM | POA: Diagnosis not present

## 2024-01-16 DIAGNOSIS — R590 Localized enlarged lymph nodes: Secondary | ICD-10-CM | POA: Diagnosis not present

## 2024-01-16 DIAGNOSIS — I517 Cardiomegaly: Secondary | ICD-10-CM | POA: Diagnosis not present

## 2024-01-16 DIAGNOSIS — E669 Obesity, unspecified: Secondary | ICD-10-CM | POA: Diagnosis not present

## 2024-01-16 DIAGNOSIS — Z20822 Contact with and (suspected) exposure to covid-19: Secondary | ICD-10-CM | POA: Diagnosis not present

## 2024-01-16 DIAGNOSIS — Z791 Long term (current) use of non-steroidal anti-inflammatories (NSAID): Secondary | ICD-10-CM | POA: Diagnosis not present

## 2024-01-16 DIAGNOSIS — E1165 Type 2 diabetes mellitus with hyperglycemia: Secondary | ICD-10-CM | POA: Diagnosis not present

## 2024-01-16 DIAGNOSIS — R42 Dizziness and giddiness: Secondary | ICD-10-CM | POA: Diagnosis not present

## 2024-01-16 DIAGNOSIS — I639 Cerebral infarction, unspecified: Secondary | ICD-10-CM | POA: Diagnosis not present

## 2024-01-16 DIAGNOSIS — Z7409 Other reduced mobility: Secondary | ICD-10-CM | POA: Diagnosis not present

## 2024-01-16 DIAGNOSIS — Z79899 Other long term (current) drug therapy: Secondary | ICD-10-CM | POA: Diagnosis not present

## 2024-01-16 DIAGNOSIS — E041 Nontoxic single thyroid nodule: Secondary | ICD-10-CM | POA: Diagnosis not present

## 2024-01-16 DIAGNOSIS — J209 Acute bronchitis, unspecified: Secondary | ICD-10-CM | POA: Diagnosis not present

## 2024-01-16 DIAGNOSIS — R29702 NIHSS score 2: Secondary | ICD-10-CM | POA: Diagnosis not present

## 2024-01-16 DIAGNOSIS — I480 Paroxysmal atrial fibrillation: Secondary | ICD-10-CM | POA: Diagnosis not present

## 2024-01-16 DIAGNOSIS — K5900 Constipation, unspecified: Secondary | ICD-10-CM | POA: Diagnosis not present

## 2024-01-16 DIAGNOSIS — I63442 Cerebral infarction due to embolism of left cerebellar artery: Secondary | ICD-10-CM | POA: Diagnosis not present

## 2024-01-16 DIAGNOSIS — B9620 Unspecified Escherichia coli [E. coli] as the cause of diseases classified elsewhere: Secondary | ICD-10-CM | POA: Diagnosis not present

## 2024-01-16 DIAGNOSIS — F17210 Nicotine dependence, cigarettes, uncomplicated: Secondary | ICD-10-CM | POA: Diagnosis not present

## 2024-01-16 DIAGNOSIS — I10 Essential (primary) hypertension: Secondary | ICD-10-CM | POA: Diagnosis not present

## 2024-01-16 DIAGNOSIS — N390 Urinary tract infection, site not specified: Secondary | ICD-10-CM | POA: Diagnosis not present

## 2024-01-16 DIAGNOSIS — Z716 Tobacco abuse counseling: Secondary | ICD-10-CM | POA: Diagnosis not present

## 2024-01-27 DIAGNOSIS — Z794 Long term (current) use of insulin: Secondary | ICD-10-CM | POA: Diagnosis not present

## 2024-01-27 DIAGNOSIS — Z8673 Personal history of transient ischemic attack (TIA), and cerebral infarction without residual deficits: Secondary | ICD-10-CM | POA: Diagnosis not present

## 2024-01-27 DIAGNOSIS — E1065 Type 1 diabetes mellitus with hyperglycemia: Secondary | ICD-10-CM | POA: Diagnosis not present

## 2024-01-27 DIAGNOSIS — Z7409 Other reduced mobility: Secondary | ICD-10-CM | POA: Diagnosis not present

## 2024-01-27 DIAGNOSIS — F1010 Alcohol abuse, uncomplicated: Secondary | ICD-10-CM | POA: Diagnosis not present

## 2024-01-27 DIAGNOSIS — I480 Paroxysmal atrial fibrillation: Secondary | ICD-10-CM | POA: Diagnosis not present

## 2024-01-27 DIAGNOSIS — R42 Dizziness and giddiness: Secondary | ICD-10-CM | POA: Diagnosis not present

## 2024-01-27 DIAGNOSIS — Z7901 Long term (current) use of anticoagulants: Secondary | ICD-10-CM | POA: Diagnosis not present

## 2024-01-27 DIAGNOSIS — N390 Urinary tract infection, site not specified: Secondary | ICD-10-CM | POA: Diagnosis not present

## 2024-01-27 DIAGNOSIS — Z9181 History of falling: Secondary | ICD-10-CM | POA: Diagnosis not present

## 2024-01-27 DIAGNOSIS — B9620 Unspecified Escherichia coli [E. coli] as the cause of diseases classified elsewhere: Secondary | ICD-10-CM | POA: Diagnosis not present

## 2024-01-27 DIAGNOSIS — K5900 Constipation, unspecified: Secondary | ICD-10-CM | POA: Diagnosis not present

## 2024-01-27 DIAGNOSIS — I10 Essential (primary) hypertension: Secondary | ICD-10-CM | POA: Diagnosis not present

## 2024-01-27 DIAGNOSIS — F17210 Nicotine dependence, cigarettes, uncomplicated: Secondary | ICD-10-CM | POA: Diagnosis not present

## 2024-01-27 DIAGNOSIS — E119 Type 2 diabetes mellitus without complications: Secondary | ICD-10-CM | POA: Diagnosis not present

## 2024-02-02 DIAGNOSIS — K5900 Constipation, unspecified: Secondary | ICD-10-CM | POA: Diagnosis not present

## 2024-02-02 DIAGNOSIS — F17210 Nicotine dependence, cigarettes, uncomplicated: Secondary | ICD-10-CM | POA: Diagnosis not present

## 2024-02-02 DIAGNOSIS — Z794 Long term (current) use of insulin: Secondary | ICD-10-CM | POA: Diagnosis not present

## 2024-02-02 DIAGNOSIS — B9620 Unspecified Escherichia coli [E. coli] as the cause of diseases classified elsewhere: Secondary | ICD-10-CM | POA: Diagnosis not present

## 2024-02-02 DIAGNOSIS — N390 Urinary tract infection, site not specified: Secondary | ICD-10-CM | POA: Diagnosis not present

## 2024-02-02 DIAGNOSIS — Z7901 Long term (current) use of anticoagulants: Secondary | ICD-10-CM | POA: Diagnosis not present

## 2024-02-02 DIAGNOSIS — Z8673 Personal history of transient ischemic attack (TIA), and cerebral infarction without residual deficits: Secondary | ICD-10-CM | POA: Diagnosis not present

## 2024-02-02 DIAGNOSIS — E119 Type 2 diabetes mellitus without complications: Secondary | ICD-10-CM | POA: Diagnosis not present

## 2024-02-02 DIAGNOSIS — F1010 Alcohol abuse, uncomplicated: Secondary | ICD-10-CM | POA: Diagnosis not present

## 2024-02-02 DIAGNOSIS — Z7409 Other reduced mobility: Secondary | ICD-10-CM | POA: Diagnosis not present

## 2024-02-02 DIAGNOSIS — R42 Dizziness and giddiness: Secondary | ICD-10-CM | POA: Diagnosis not present

## 2024-02-02 DIAGNOSIS — I10 Essential (primary) hypertension: Secondary | ICD-10-CM | POA: Diagnosis not present

## 2024-02-02 DIAGNOSIS — Z9181 History of falling: Secondary | ICD-10-CM | POA: Diagnosis not present

## 2024-02-02 DIAGNOSIS — I480 Paroxysmal atrial fibrillation: Secondary | ICD-10-CM | POA: Diagnosis not present

## 2024-02-10 DIAGNOSIS — K5900 Constipation, unspecified: Secondary | ICD-10-CM | POA: Diagnosis not present

## 2024-02-10 DIAGNOSIS — N390 Urinary tract infection, site not specified: Secondary | ICD-10-CM | POA: Diagnosis not present

## 2024-02-10 DIAGNOSIS — F17210 Nicotine dependence, cigarettes, uncomplicated: Secondary | ICD-10-CM | POA: Diagnosis not present

## 2024-02-10 DIAGNOSIS — Z9181 History of falling: Secondary | ICD-10-CM | POA: Diagnosis not present

## 2024-02-10 DIAGNOSIS — Z8673 Personal history of transient ischemic attack (TIA), and cerebral infarction without residual deficits: Secondary | ICD-10-CM | POA: Diagnosis not present

## 2024-02-10 DIAGNOSIS — Z794 Long term (current) use of insulin: Secondary | ICD-10-CM | POA: Diagnosis not present

## 2024-02-10 DIAGNOSIS — I10 Essential (primary) hypertension: Secondary | ICD-10-CM | POA: Diagnosis not present

## 2024-02-10 DIAGNOSIS — Z7409 Other reduced mobility: Secondary | ICD-10-CM | POA: Diagnosis not present

## 2024-02-10 DIAGNOSIS — R42 Dizziness and giddiness: Secondary | ICD-10-CM | POA: Diagnosis not present

## 2024-02-10 DIAGNOSIS — F1010 Alcohol abuse, uncomplicated: Secondary | ICD-10-CM | POA: Diagnosis not present

## 2024-02-10 DIAGNOSIS — Z7901 Long term (current) use of anticoagulants: Secondary | ICD-10-CM | POA: Diagnosis not present

## 2024-02-10 DIAGNOSIS — B9620 Unspecified Escherichia coli [E. coli] as the cause of diseases classified elsewhere: Secondary | ICD-10-CM | POA: Diagnosis not present

## 2024-02-10 DIAGNOSIS — I480 Paroxysmal atrial fibrillation: Secondary | ICD-10-CM | POA: Diagnosis not present

## 2024-02-10 DIAGNOSIS — E119 Type 2 diabetes mellitus without complications: Secondary | ICD-10-CM | POA: Diagnosis not present

## 2024-02-17 DIAGNOSIS — I480 Paroxysmal atrial fibrillation: Secondary | ICD-10-CM | POA: Diagnosis not present

## 2024-02-17 DIAGNOSIS — I10 Essential (primary) hypertension: Secondary | ICD-10-CM | POA: Diagnosis not present

## 2024-02-17 DIAGNOSIS — Z7409 Other reduced mobility: Secondary | ICD-10-CM | POA: Diagnosis not present

## 2024-02-17 DIAGNOSIS — Z794 Long term (current) use of insulin: Secondary | ICD-10-CM | POA: Diagnosis not present

## 2024-02-17 DIAGNOSIS — F1010 Alcohol abuse, uncomplicated: Secondary | ICD-10-CM | POA: Diagnosis not present

## 2024-02-17 DIAGNOSIS — F17210 Nicotine dependence, cigarettes, uncomplicated: Secondary | ICD-10-CM | POA: Diagnosis not present

## 2024-02-17 DIAGNOSIS — E119 Type 2 diabetes mellitus without complications: Secondary | ICD-10-CM | POA: Diagnosis not present

## 2024-02-17 DIAGNOSIS — Z8673 Personal history of transient ischemic attack (TIA), and cerebral infarction without residual deficits: Secondary | ICD-10-CM | POA: Diagnosis not present

## 2024-02-17 DIAGNOSIS — N390 Urinary tract infection, site not specified: Secondary | ICD-10-CM | POA: Diagnosis not present

## 2024-02-17 DIAGNOSIS — K5900 Constipation, unspecified: Secondary | ICD-10-CM | POA: Diagnosis not present

## 2024-02-17 DIAGNOSIS — R42 Dizziness and giddiness: Secondary | ICD-10-CM | POA: Diagnosis not present

## 2024-02-17 DIAGNOSIS — Z7901 Long term (current) use of anticoagulants: Secondary | ICD-10-CM | POA: Diagnosis not present

## 2024-02-17 DIAGNOSIS — Z9181 History of falling: Secondary | ICD-10-CM | POA: Diagnosis not present

## 2024-02-17 DIAGNOSIS — B9620 Unspecified Escherichia coli [E. coli] as the cause of diseases classified elsewhere: Secondary | ICD-10-CM | POA: Diagnosis not present

## 2024-02-23 DIAGNOSIS — Z7409 Other reduced mobility: Secondary | ICD-10-CM | POA: Diagnosis not present

## 2024-02-23 DIAGNOSIS — Z794 Long term (current) use of insulin: Secondary | ICD-10-CM | POA: Diagnosis not present

## 2024-02-23 DIAGNOSIS — R42 Dizziness and giddiness: Secondary | ICD-10-CM | POA: Diagnosis not present

## 2024-02-23 DIAGNOSIS — F17210 Nicotine dependence, cigarettes, uncomplicated: Secondary | ICD-10-CM | POA: Diagnosis not present

## 2024-02-23 DIAGNOSIS — Z7901 Long term (current) use of anticoagulants: Secondary | ICD-10-CM | POA: Diagnosis not present

## 2024-02-23 DIAGNOSIS — E119 Type 2 diabetes mellitus without complications: Secondary | ICD-10-CM | POA: Diagnosis not present

## 2024-02-23 DIAGNOSIS — I10 Essential (primary) hypertension: Secondary | ICD-10-CM | POA: Diagnosis not present

## 2024-02-23 DIAGNOSIS — B9620 Unspecified Escherichia coli [E. coli] as the cause of diseases classified elsewhere: Secondary | ICD-10-CM | POA: Diagnosis not present

## 2024-02-23 DIAGNOSIS — Z9181 History of falling: Secondary | ICD-10-CM | POA: Diagnosis not present

## 2024-02-23 DIAGNOSIS — F1010 Alcohol abuse, uncomplicated: Secondary | ICD-10-CM | POA: Diagnosis not present

## 2024-02-23 DIAGNOSIS — Z8673 Personal history of transient ischemic attack (TIA), and cerebral infarction without residual deficits: Secondary | ICD-10-CM | POA: Diagnosis not present

## 2024-02-23 DIAGNOSIS — K5900 Constipation, unspecified: Secondary | ICD-10-CM | POA: Diagnosis not present

## 2024-02-23 DIAGNOSIS — I480 Paroxysmal atrial fibrillation: Secondary | ICD-10-CM | POA: Diagnosis not present

## 2024-02-23 DIAGNOSIS — N390 Urinary tract infection, site not specified: Secondary | ICD-10-CM | POA: Diagnosis not present

## 2024-03-09 DIAGNOSIS — E108 Type 1 diabetes mellitus with unspecified complications: Secondary | ICD-10-CM | POA: Diagnosis not present

## 2024-03-09 DIAGNOSIS — Z794 Long term (current) use of insulin: Secondary | ICD-10-CM | POA: Diagnosis not present

## 2024-03-09 DIAGNOSIS — E109 Type 1 diabetes mellitus without complications: Secondary | ICD-10-CM | POA: Diagnosis not present

## 2024-03-09 DIAGNOSIS — E1065 Type 1 diabetes mellitus with hyperglycemia: Secondary | ICD-10-CM | POA: Diagnosis not present

## 2024-03-21 DIAGNOSIS — Z7901 Long term (current) use of anticoagulants: Secondary | ICD-10-CM | POA: Diagnosis not present

## 2024-03-21 DIAGNOSIS — I700 Atherosclerosis of aorta: Secondary | ICD-10-CM | POA: Diagnosis not present

## 2024-03-21 DIAGNOSIS — R000 Tachycardia, unspecified: Secondary | ICD-10-CM | POA: Diagnosis not present

## 2024-03-21 DIAGNOSIS — Z8679 Personal history of other diseases of the circulatory system: Secondary | ICD-10-CM | POA: Diagnosis not present

## 2024-03-21 DIAGNOSIS — F17210 Nicotine dependence, cigarettes, uncomplicated: Secondary | ICD-10-CM | POA: Diagnosis not present

## 2024-03-21 DIAGNOSIS — I4891 Unspecified atrial fibrillation: Secondary | ICD-10-CM | POA: Diagnosis not present

## 2024-03-21 DIAGNOSIS — Z79899 Other long term (current) drug therapy: Secondary | ICD-10-CM | POA: Diagnosis not present

## 2024-03-21 DIAGNOSIS — Z8673 Personal history of transient ischemic attack (TIA), and cerebral infarction without residual deficits: Secondary | ICD-10-CM | POA: Diagnosis not present

## 2024-03-22 DIAGNOSIS — Y900 Blood alcohol level of less than 20 mg/100 ml: Secondary | ICD-10-CM | POA: Diagnosis not present

## 2024-03-22 DIAGNOSIS — R42 Dizziness and giddiness: Secondary | ICD-10-CM | POA: Diagnosis not present

## 2024-03-22 DIAGNOSIS — Z79899 Other long term (current) drug therapy: Secondary | ICD-10-CM | POA: Diagnosis not present

## 2024-03-22 DIAGNOSIS — R000 Tachycardia, unspecified: Secondary | ICD-10-CM | POA: Diagnosis not present

## 2024-03-22 DIAGNOSIS — I483 Typical atrial flutter: Secondary | ICD-10-CM | POA: Diagnosis not present

## 2024-03-22 DIAGNOSIS — F1010 Alcohol abuse, uncomplicated: Secondary | ICD-10-CM | POA: Diagnosis not present

## 2024-03-22 DIAGNOSIS — I480 Paroxysmal atrial fibrillation: Secondary | ICD-10-CM | POA: Diagnosis not present

## 2024-03-22 DIAGNOSIS — I493 Ventricular premature depolarization: Secondary | ICD-10-CM | POA: Diagnosis not present

## 2024-03-22 DIAGNOSIS — E1022 Type 1 diabetes mellitus with diabetic chronic kidney disease: Secondary | ICD-10-CM | POA: Diagnosis not present

## 2024-03-22 DIAGNOSIS — I4891 Unspecified atrial fibrillation: Secondary | ICD-10-CM | POA: Diagnosis not present

## 2024-03-22 DIAGNOSIS — E785 Hyperlipidemia, unspecified: Secondary | ICD-10-CM | POA: Diagnosis not present

## 2024-03-22 DIAGNOSIS — R002 Palpitations: Secondary | ICD-10-CM | POA: Diagnosis not present

## 2024-03-22 DIAGNOSIS — Z8673 Personal history of transient ischemic attack (TIA), and cerebral infarction without residual deficits: Secondary | ICD-10-CM | POA: Diagnosis not present

## 2024-03-22 DIAGNOSIS — K5900 Constipation, unspecified: Secondary | ICD-10-CM | POA: Diagnosis not present

## 2024-03-22 DIAGNOSIS — N182 Chronic kidney disease, stage 2 (mild): Secondary | ICD-10-CM | POA: Diagnosis not present

## 2024-03-22 DIAGNOSIS — Z7901 Long term (current) use of anticoagulants: Secondary | ICD-10-CM | POA: Diagnosis not present

## 2024-03-22 DIAGNOSIS — Z794 Long term (current) use of insulin: Secondary | ICD-10-CM | POA: Diagnosis not present

## 2024-03-22 DIAGNOSIS — I129 Hypertensive chronic kidney disease with stage 1 through stage 4 chronic kidney disease, or unspecified chronic kidney disease: Secondary | ICD-10-CM | POA: Diagnosis not present

## 2024-03-22 DIAGNOSIS — K219 Gastro-esophageal reflux disease without esophagitis: Secondary | ICD-10-CM | POA: Diagnosis not present

## 2024-03-22 DIAGNOSIS — F17210 Nicotine dependence, cigarettes, uncomplicated: Secondary | ICD-10-CM | POA: Diagnosis not present

## 2024-03-22 DIAGNOSIS — E109 Type 1 diabetes mellitus without complications: Secondary | ICD-10-CM | POA: Diagnosis not present

## 2024-03-22 DIAGNOSIS — I252 Old myocardial infarction: Secondary | ICD-10-CM | POA: Diagnosis not present

## 2024-03-22 DIAGNOSIS — Z87898 Personal history of other specified conditions: Secondary | ICD-10-CM | POA: Diagnosis not present

## 2024-03-22 DIAGNOSIS — E1040 Type 1 diabetes mellitus with diabetic neuropathy, unspecified: Secondary | ICD-10-CM | POA: Diagnosis not present

## 2024-03-23 DIAGNOSIS — I499 Cardiac arrhythmia, unspecified: Secondary | ICD-10-CM | POA: Diagnosis not present

## 2024-03-23 DIAGNOSIS — I4891 Unspecified atrial fibrillation: Secondary | ICD-10-CM | POA: Diagnosis not present

## 2024-03-23 DIAGNOSIS — I493 Ventricular premature depolarization: Secondary | ICD-10-CM | POA: Diagnosis not present

## 2024-03-23 DIAGNOSIS — R000 Tachycardia, unspecified: Secondary | ICD-10-CM | POA: Diagnosis not present

## 2024-03-23 DIAGNOSIS — E109 Type 1 diabetes mellitus without complications: Secondary | ICD-10-CM | POA: Diagnosis not present

## 2024-03-23 DIAGNOSIS — Z794 Long term (current) use of insulin: Secondary | ICD-10-CM | POA: Diagnosis not present

## 2024-03-24 DIAGNOSIS — I4891 Unspecified atrial fibrillation: Secondary | ICD-10-CM | POA: Diagnosis not present

## 2024-03-24 DIAGNOSIS — I499 Cardiac arrhythmia, unspecified: Secondary | ICD-10-CM | POA: Diagnosis not present

## 2024-04-13 DIAGNOSIS — I480 Paroxysmal atrial fibrillation: Secondary | ICD-10-CM | POA: Diagnosis not present

## 2024-05-23 DIAGNOSIS — R1310 Dysphagia, unspecified: Secondary | ICD-10-CM | POA: Diagnosis not present

## 2024-05-23 DIAGNOSIS — I639 Cerebral infarction, unspecified: Secondary | ICD-10-CM | POA: Diagnosis not present

## 2024-05-23 DIAGNOSIS — R519 Headache, unspecified: Secondary | ICD-10-CM | POA: Diagnosis not present

## 2024-05-23 DIAGNOSIS — I6389 Other cerebral infarction: Secondary | ICD-10-CM | POA: Diagnosis not present

## 2024-05-23 DIAGNOSIS — I6340 Cerebral infarction due to embolism of unspecified cerebral artery: Secondary | ICD-10-CM | POA: Diagnosis not present

## 2024-05-24 DIAGNOSIS — I639 Cerebral infarction, unspecified: Secondary | ICD-10-CM | POA: Diagnosis not present

## 2024-05-24 DIAGNOSIS — Z8673 Personal history of transient ischemic attack (TIA), and cerebral infarction without residual deficits: Secondary | ICD-10-CM | POA: Diagnosis not present

## 2024-05-24 DIAGNOSIS — E1042 Type 1 diabetes mellitus with diabetic polyneuropathy: Secondary | ICD-10-CM | POA: Diagnosis not present

## 2024-05-24 DIAGNOSIS — R519 Headache, unspecified: Secondary | ICD-10-CM | POA: Diagnosis not present

## 2024-05-24 DIAGNOSIS — E1065 Type 1 diabetes mellitus with hyperglycemia: Secondary | ICD-10-CM | POA: Diagnosis not present

## 2024-05-24 DIAGNOSIS — E1049 Type 1 diabetes mellitus with other diabetic neurological complication: Secondary | ICD-10-CM | POA: Diagnosis not present

## 2024-05-24 DIAGNOSIS — I6340 Cerebral infarction due to embolism of unspecified cerebral artery: Secondary | ICD-10-CM | POA: Diagnosis not present

## 2024-05-24 DIAGNOSIS — I517 Cardiomegaly: Secondary | ICD-10-CM | POA: Diagnosis not present

## 2024-05-25 DIAGNOSIS — E1049 Type 1 diabetes mellitus with other diabetic neurological complication: Secondary | ICD-10-CM | POA: Diagnosis not present

## 2024-05-25 DIAGNOSIS — I639 Cerebral infarction, unspecified: Secondary | ICD-10-CM | POA: Diagnosis not present

## 2024-05-25 DIAGNOSIS — Z8673 Personal history of transient ischemic attack (TIA), and cerebral infarction without residual deficits: Secondary | ICD-10-CM | POA: Diagnosis not present

## 2024-05-25 DIAGNOSIS — E1042 Type 1 diabetes mellitus with diabetic polyneuropathy: Secondary | ICD-10-CM | POA: Diagnosis not present

## 2024-05-26 DIAGNOSIS — I639 Cerebral infarction, unspecified: Secondary | ICD-10-CM | POA: Diagnosis not present

## 2024-05-26 DIAGNOSIS — E1049 Type 1 diabetes mellitus with other diabetic neurological complication: Secondary | ICD-10-CM | POA: Diagnosis not present

## 2024-05-26 DIAGNOSIS — I493 Ventricular premature depolarization: Secondary | ICD-10-CM | POA: Diagnosis not present

## 2024-05-26 DIAGNOSIS — E1042 Type 1 diabetes mellitus with diabetic polyneuropathy: Secondary | ICD-10-CM | POA: Diagnosis not present

## 2024-05-26 DIAGNOSIS — I4891 Unspecified atrial fibrillation: Secondary | ICD-10-CM | POA: Diagnosis not present

## 2024-05-26 DIAGNOSIS — Z8673 Personal history of transient ischemic attack (TIA), and cerebral infarction without residual deficits: Secondary | ICD-10-CM | POA: Diagnosis not present

## 2024-05-26 DIAGNOSIS — E1065 Type 1 diabetes mellitus with hyperglycemia: Secondary | ICD-10-CM | POA: Diagnosis not present

## 2024-05-31 DIAGNOSIS — M9902 Segmental and somatic dysfunction of thoracic region: Secondary | ICD-10-CM | POA: Diagnosis not present

## 2024-05-31 DIAGNOSIS — E109 Type 1 diabetes mellitus without complications: Secondary | ICD-10-CM | POA: Diagnosis not present

## 2024-05-31 DIAGNOSIS — M51372 Other intervertebral disc degeneration, lumbosacral region with discogenic back pain and lower extremity pain: Secondary | ICD-10-CM | POA: Diagnosis not present

## 2024-05-31 DIAGNOSIS — M9903 Segmental and somatic dysfunction of lumbar region: Secondary | ICD-10-CM | POA: Diagnosis not present

## 2024-05-31 DIAGNOSIS — M9901 Segmental and somatic dysfunction of cervical region: Secondary | ICD-10-CM | POA: Diagnosis not present

## 2024-06-01 DIAGNOSIS — M9903 Segmental and somatic dysfunction of lumbar region: Secondary | ICD-10-CM | POA: Diagnosis not present

## 2024-06-01 DIAGNOSIS — M51372 Other intervertebral disc degeneration, lumbosacral region with discogenic back pain and lower extremity pain: Secondary | ICD-10-CM | POA: Diagnosis not present

## 2024-06-01 DIAGNOSIS — M9902 Segmental and somatic dysfunction of thoracic region: Secondary | ICD-10-CM | POA: Diagnosis not present

## 2024-06-01 DIAGNOSIS — M9901 Segmental and somatic dysfunction of cervical region: Secondary | ICD-10-CM | POA: Diagnosis not present

## 2024-06-02 DIAGNOSIS — M51372 Other intervertebral disc degeneration, lumbosacral region with discogenic back pain and lower extremity pain: Secondary | ICD-10-CM | POA: Diagnosis not present

## 2024-06-02 DIAGNOSIS — M9901 Segmental and somatic dysfunction of cervical region: Secondary | ICD-10-CM | POA: Diagnosis not present

## 2024-06-02 DIAGNOSIS — M9902 Segmental and somatic dysfunction of thoracic region: Secondary | ICD-10-CM | POA: Diagnosis not present

## 2024-06-02 DIAGNOSIS — M9903 Segmental and somatic dysfunction of lumbar region: Secondary | ICD-10-CM | POA: Diagnosis not present

## 2024-06-10 DIAGNOSIS — R293 Abnormal posture: Secondary | ICD-10-CM | POA: Diagnosis not present

## 2024-06-10 DIAGNOSIS — R2689 Other abnormalities of gait and mobility: Secondary | ICD-10-CM | POA: Diagnosis not present

## 2024-06-10 DIAGNOSIS — Z789 Other specified health status: Secondary | ICD-10-CM | POA: Diagnosis not present

## 2024-06-10 DIAGNOSIS — I639 Cerebral infarction, unspecified: Secondary | ICD-10-CM | POA: Diagnosis not present

## 2024-06-10 DIAGNOSIS — Z7409 Other reduced mobility: Secondary | ICD-10-CM | POA: Diagnosis not present
